# Patient Record
Sex: Female | Born: 2000 | Hispanic: No | Marital: Single | Smoking: Never smoker
Health system: Southern US, Community
[De-identification: ages and names within clinical notes are randomized; demographics above are authoritative.]

---
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
## 2020-01-08 ENCOUNTER — Other Ambulatory Visit: Payer: Self-pay

## 2020-01-08 ENCOUNTER — Ambulatory Visit: Payer: Self-pay

## 2020-01-08 DIAGNOSIS — Z20822 Contact with and (suspected) exposure to covid-19: Secondary | ICD-10-CM

## 2020-01-09 ENCOUNTER — Ambulatory Visit: Payer: Self-pay | Attending: Family Medicine | Admitting: Family Medicine

## 2020-01-09 LAB — SARS-COV-2, NAA 2 DAY TAT

## 2020-01-09 LAB — NOVEL CORONAVIRUS, NAA: SARS-CoV-2, NAA: DETECTED — AB

## 2020-01-23 ENCOUNTER — Other Ambulatory Visit: Payer: Self-pay

## 2020-01-23 ENCOUNTER — Ambulatory Visit: Payer: HRSA Program | Attending: Internal Medicine

## 2020-01-23 DIAGNOSIS — Z20822 Contact with and (suspected) exposure to covid-19: Secondary | ICD-10-CM | POA: Insufficient documentation

## 2020-01-24 LAB — NOVEL CORONAVIRUS, NAA: SARS-CoV-2, NAA: NOT DETECTED

## 2020-01-24 LAB — SARS-COV-2, NAA 2 DAY TAT

## 2021-03-06 ENCOUNTER — Encounter (HOSPITAL_COMMUNITY): Payer: Self-pay

## 2021-03-06 ENCOUNTER — Ambulatory Visit (HOSPITAL_COMMUNITY)
Admission: RE | Admit: 2021-03-06 | Discharge: 2021-03-06 | Disposition: A | Discharge status: Home / Self Care | Payer: Self-pay | Source: Ambulatory Visit | Attending: Student | Admitting: Student

## 2021-03-06 ENCOUNTER — Other Ambulatory Visit: Payer: Self-pay

## 2021-03-06 VITALS — BP 104/66 | HR 75 | Temp 99.2°F | Resp 15

## 2021-03-06 DIAGNOSIS — T162XXA Foreign body in left ear, initial encounter: Secondary | ICD-10-CM

## 2021-03-06 DIAGNOSIS — Z113 Encounter for screening for infections with a predominantly sexual mode of transmission: Secondary | ICD-10-CM

## 2021-03-06 DIAGNOSIS — N898 Other specified noninflammatory disorders of vagina: Secondary | ICD-10-CM

## 2021-03-06 LAB — POCT URINALYSIS DIPSTICK, ED / UC
Bilirubin Urine: NEGATIVE
Glucose, UA: NEGATIVE mg/dL
Ketones, ur: 15 mg/dL — AB
Nitrite: NEGATIVE
Protein, ur: 30 mg/dL — AB
Specific Gravity, Urine: 1.02 (ref 1.005–1.030)
Urobilinogen, UA: 1 mg/dL (ref 0.0–1.0)
pH: 7.5 (ref 5.0–8.0)

## 2021-03-06 LAB — POC URINE PREG, ED: Preg Test, Ur: NEGATIVE

## 2021-03-06 LAB — HIV ANTIBODY (ROUTINE TESTING W REFLEX): HIV Screen 4th Generation wRfx: NONREACTIVE

## 2021-03-06 NOTE — Discharge Instructions (Addendum)
-  We are testing your urine for bacteria, and we are also testing for BV, yeast, gonorrhea, chlamydia, trichomonas, HIV, syphilis.  We will call you if any of this is positive, and sent can send additional treatment at that time.  Do seek additional medical treatment if you develop worsening symptoms like abdominal pain, back pain, worsening vaginal symptoms, new fever/chills.  Abstain from intercourse until negative results. -Wait a few days until your ear irritation subsides. Purchase an over-the-counter Debrox kit.  You can use this to flush out your ears at home.  You can also purchase hydrogen peroxide and mix this half hydrogen peroxide and half water to flush out your ears.  If your symptoms persist despite this treatment, follow-up with an ear nose and throat doctor.  Information below, call them to schedule this.

## 2021-03-06 NOTE — ED Triage Notes (Signed)
Pt reports left ear pain x 2 days. Denies fever, drainage.

## 2021-03-06 NOTE — ED Provider Notes (Signed)
[facility]    CSN: [id number] Arrival date & time: 03/06/21  1435      History   Chief Complaint Chief Complaint  Patient presents with   Appointment    1500   Otalgia    HPI Patty Parker is a 20 y.o. female presenting for left ear pain for 2 days.  Also notes dysuria x3 weeks. Denies history UTIs in the past.  -States 2 days ago, she felt a bug crawl into her left ear, it scratched around and was painful, but this did resolve.  She does have some muffled hearing.  Denies recent URI.  Denies tinnitus, dizziness, fever/chills. -Also notices dysuria and external vaginal irritation for about 3 weeks.  She does have a new female partner.  Denies other vaginal or urinary symptoms including vaginal discharge, external vaginal lesions or rashes. Denies hematuria, frequency, urgency, back pain, n/v/d/abd pain, fevers/chills, abdnormal vaginal discharge/rashes.   HPI  History reviewed. No pertinent past medical history.  There are no problems to display for this patient.   History reviewed. No pertinent surgical history.  OB History   No obstetric history on file.      Home Medications    Prior to Admission medications   Medication Sig Start Date End Date Taking? Authorizing Provider  dicyclomine (BENTYL) 20 MG tablet Take 1 tablet (20 mg total) by mouth 2 (two) times daily as needed for spasms. 06/16/17   Charmayne Sheer, NP  meclizine (ANTIVERT) 12.5 MG tablet Take 1 tablet (12.5 mg total) by mouth 3 (three) times daily as needed for dizziness. 01/31/17   McDonald, Laymond Purser, PA-C    Family History Family History  Problem Relation Age of Onset   Healthy Mother    Healthy Father     Social History Social History   Tobacco Use   Smoking status: Never   Smokeless tobacco: Never  Vaping Use   Vaping Use: Never used  Substance Use Topics   Alcohol use: Never   Drug use: Never     Allergies   Patient has no known allergies.   Review of  Systems Review of Systems  Constitutional:  Negative for appetite change, chills, diaphoresis and fever.  HENT:  Positive for ear pain.   Respiratory:  Negative for shortness of breath.   Cardiovascular:  Negative for chest pain.  Gastrointestinal:  Negative for abdominal pain, blood in stool, constipation, diarrhea, nausea and vomiting.  Genitourinary:  Positive for dysuria. Negative for decreased urine volume, difficulty urinating, flank pain, frequency, genital sores, hematuria, menstrual problem, pelvic pain, urgency, vaginal bleeding, vaginal discharge and vaginal pain.  Musculoskeletal:  Negative for back pain.  Neurological:  Negative for dizziness, weakness and light-headedness.  All other systems reviewed and are negative.   Physical Exam Triage Vital Signs ED Triage Vitals  Enc Vitals Group     BP 03/06/21 1503 104/66     Pulse Rate 03/06/21 1503 75     Resp 03/06/21 1503 15     Temp 03/06/21 1503 99.2 F (37.3 C)     Temp Source 03/06/21 1503 Oral     SpO2 03/06/21 1503 98 %     Weight --      Height --      Head Circumference --      Peak Flow --      Pain Score 03/06/21 1501 3     Pain Loc --      Pain Edu? --      Excl.  in [place]? --    No data found.  Updated Vital Signs BP 104/66 (BP Location: Right Arm)   Pulse 75   Temp 99.2 F (37.3 C) (Oral)   Resp 15   LMP  (Within Months) Comment: 1 month  SpO2 98%   Visual Acuity Right Eye Distance:   Left Eye Distance:   Bilateral Distance:    Right Eye Near:   Left Eye Near:    Bilateral Near:     Physical Exam Vitals reviewed.  Constitutional:      General: She is not in acute distress.    Appearance: Normal appearance. She is not ill-appearing.  HENT:     Head: Normocephalic and atraumatic.     Right Ear: Hearing, tympanic membrane and external ear normal. No swelling or tenderness. There is no impacted cerumen. No foreign body. No mastoid tenderness. Tympanic membrane is not perforated, erythematous,  retracted or bulging.     Left Ear: Hearing, tympanic membrane and external ear normal. No swelling or tenderness. There is no impacted cerumen. A foreign body is present. No mastoid tenderness. Tympanic membrane is not perforated, erythematous, retracted or bulging.     Ears:     Comments: L ear initially with dead insect and cerumen present. Lavage performed but this was not successfully removed. Following lavage, canal was erythematous but no bleeding.     Nose:     Right Sinus: No maxillary sinus tenderness or frontal sinus tenderness.     Left Sinus: No maxillary sinus tenderness or frontal sinus tenderness.     Mouth/Throat:     Mouth: Mucous membranes are moist.     Pharynx: Uvula midline. No oropharyngeal exudate or posterior oropharyngeal erythema.     Tonsils: No tonsillar exudate.     Comments: Moist mucous membranes Eyes:     Extraocular Movements: Extraocular movements intact.     Pupils: Pupils are equal, round, and reactive to light.  Cardiovascular:     Rate and Rhythm: Normal rate and regular rhythm.     Heart sounds: Normal heart sounds.  Pulmonary:     Effort: Pulmonary effort is normal.     Breath sounds: Normal breath sounds and air entry. No wheezing, rhonchi or rales.  Chest:     Chest wall: No tenderness.  Abdominal:     General: Abdomen is flat. Bowel sounds are normal. There is no distension.     Palpations: Abdomen is soft. There is no mass.     Tenderness: There is no abdominal tenderness. There is no right CVA tenderness, left CVA tenderness, guarding or rebound.  Genitourinary:    Comments: deferred Lymphadenopathy:     Cervical: No cervical adenopathy.  Skin:    General: Skin is warm.     Capillary Refill: Capillary refill takes less than 2 seconds.     Comments: Good skin turgor  Neurological:     General: No focal deficit present.     Mental Status: She is alert and oriented to person, place, and time.  Psychiatric:        Attention and  Perception: Attention and perception normal.        Mood and Affect: Mood and affect normal.        Behavior: Behavior normal. Behavior is cooperative.        Thought Content: Thought content normal.        Judgment: Judgment normal.     UC Treatments / Results  Labs (all labs ordered are listed,  but only abnormal results are displayed) Labs Reviewed  POCT URINALYSIS DIPSTICK, ED / UC - Abnormal; Notable for the following components:      Result Value   Ketones, ur 15 (*)    Hgb urine dipstick SMALL (*)    Protein, ur 30 (*)    Leukocytes,Ua LARGE (*)    All other components within normal limits  URINE CULTURE  HIV ANTIBODY (ROUTINE TESTING W REFLEX)  RPR  POC URINE PREG, ED  CERVICOVAGINAL ANCILLARY ONLY    EKG   Radiology No results found.  Procedures Procedures (including critical care time)  Medications Ordered in UC Medications - No data to display  Initial Impression / Assessment and Plan / UC Course  I have reviewed the triage vital signs and the nursing notes.  Pertinent labs & imaging results that were available during my care of the patient were reviewed by me and considered in my medical decision making (see chart for details).     This patient is a 20 year old female presenting with insect in L ear and suspected BV. Today this pt is afebrile nontachycardic nontachypneic, oxygenating well on room air, no wheezes rhonchi or rales. No abd pain or CVAT.  Ear lavage performed but insect and cerumen was not successfully removed. Did not attempt removal with instrumentation as canal was irritated from lavage. Rec Debrox OTC and ENT f/u.   UA- small blood, large leuk. Culture sent. Will wait to treat as pt has minimal urinary symptoms. Urine pregnancy negative One new female partner-self swab sent for gonorrhea, chlamydia, trichomonas, BV, yeast. Safe sex precautions.  ED return precautions discussed. Patient verbalizes understanding and agreement.   Coding  this visit a Level 4 for time as I spent over 40 minutes evaluating this patient's multiple acute conditions, including labs, interpretation of labs, ear lavage, exam, treatment plan, and plan for follow-up.  Final Clinical Impressions(s) / UC Diagnoses   Final diagnoses:  Foreign body of left ear, initial encounter  Vaginal irritation  Routine screening for STI (sexually transmitted infection)     Discharge Instructions      -We are testing your urine for bacteria, and we are also testing for BV, yeast, gonorrhea, chlamydia, trichomonas, HIV, syphilis.  We will call you if any of this is positive, and sent can send additional treatment at that time.  Do seek additional medical treatment if you develop worsening symptoms like abdominal pain, back pain, worsening vaginal symptoms, new fever/chills.  Abstain from intercourse until negative results. -Wait a few days until your ear irritation subsides. Purchase an over-the-counter Debrox kit.  You can use this to flush out your ears at home.  You can also purchase hydrogen peroxide and mix this half hydrogen peroxide and half water to flush out your ears.  If your symptoms persist despite this treatment, follow-up with an ear nose and throat doctor.  Information below, call them to schedule this.     ED Prescriptions   None    PDMP not reviewed this encounter.   Hazel Sams, PA-C 03/06/21 1603

## 2021-03-07 LAB — RPR: RPR Ser Ql: NONREACTIVE

## 2021-03-08 LAB — URINE CULTURE: Culture: 70000 — AB

## 2021-03-09 LAB — CERVICOVAGINAL ANCILLARY ONLY
Bacterial Vaginitis (gardnerella): POSITIVE — AB
Candida Glabrata: NEGATIVE
Candida Vaginitis: POSITIVE — AB
Chlamydia: NEGATIVE
Comment: NEGATIVE
Comment: NEGATIVE
Comment: NEGATIVE
Comment: NEGATIVE
Comment: NEGATIVE
Comment: NORMAL
Neisseria Gonorrhea: NEGATIVE
Trichomonas: NEGATIVE

## 2021-03-12 ENCOUNTER — Other Ambulatory Visit: Payer: Self-pay

## 2021-03-12 ENCOUNTER — Telehealth (HOSPITAL_COMMUNITY): Payer: Self-pay | Admitting: Emergency Medicine

## 2021-03-12 MED ORDER — METRONIDAZOLE 500 MG PO TABS
500.0000 mg | ORAL_TABLET | Freq: Two times a day (BID) | ORAL | 0 refills | Status: DC
  Filled 2021-03-12: qty 14, 7d supply, fill #0

## 2021-03-12 MED ORDER — FLUCONAZOLE 150 MG PO TABS
150.0000 mg | ORAL_TABLET | Freq: Once | ORAL | 0 refills | Status: AC
  Filled 2021-03-12: qty 2, 2d supply, fill #0

## 2021-11-02 ENCOUNTER — Emergency Department (HOSPITAL_COMMUNITY): Payer: Self-pay

## 2021-11-02 ENCOUNTER — Encounter (HOSPITAL_COMMUNITY): Payer: Self-pay | Admitting: Emergency Medicine

## 2021-11-02 ENCOUNTER — Other Ambulatory Visit: Payer: Self-pay

## 2021-11-02 ENCOUNTER — Emergency Department (HOSPITAL_COMMUNITY)
Admission: EM | Admit: 2021-11-02 | Discharge: 2021-11-02 | Disposition: A | Discharge status: Home / Self Care | Payer: Self-pay | Attending: Emergency Medicine | Admitting: Emergency Medicine

## 2021-11-02 DIAGNOSIS — Y92410 Unspecified street and highway as the place of occurrence of the external cause: Secondary | ICD-10-CM | POA: Insufficient documentation

## 2021-11-02 DIAGNOSIS — S0181XA Laceration without foreign body of other part of head, initial encounter: Secondary | ICD-10-CM | POA: Insufficient documentation

## 2021-11-02 DIAGNOSIS — Z23 Encounter for immunization: Secondary | ICD-10-CM | POA: Insufficient documentation

## 2021-11-02 DIAGNOSIS — S0993XA Unspecified injury of face, initial encounter: Secondary | ICD-10-CM | POA: Diagnosis present

## 2021-11-02 LAB — I-STAT BETA HCG BLOOD, ED (MC, WL, AP ONLY): I-stat hCG, quantitative: <5 m[IU]/mL (ref <5)

## 2021-11-02 MED ORDER — LIDOCAINE-EPINEPHRINE (PF) 2 %-1:200000 IJ SOLN
20.0000 mL | Freq: Once | INTRAMUSCULAR | Status: AC
  Administered 2021-11-02: 20 mL
  Filled 2021-11-02: qty 20

## 2021-11-02 MED ORDER — IBUPROFEN 800 MG PO TABS: 800.0000 mg | ORAL_TABLET | Freq: Three times a day (TID) | ORAL | 0 refills | Status: DC

## 2021-11-02 MED ORDER — TETANUS-DIPHTH-ACELL PERTUSSIS 5-2.5-18.5 LF-MCG/0.5 IM SUSY
0.5000 mL | PREFILLED_SYRINGE | Freq: Once | INTRAMUSCULAR | Status: AC
  Administered 2021-11-02: 0.5 mL via INTRAMUSCULAR
  Filled 2021-11-02: qty 0.5

## 2021-11-02 MED ORDER — CYCLOBENZAPRINE HCL 10 MG PO TABS: 10.0000 mg | ORAL_TABLET | Freq: Three times a day (TID) | ORAL | 0 refills | Status: DC | PRN

## 2021-11-02 NOTE — ED Notes (Signed)
Patient transported to CT 

## 2021-11-02 NOTE — Discharge Instructions (Addendum)
Call the number listed for Dr. Janace Hoard.  You will need to see him on Monday.  Ask the receptionist for the office location.  In the meantime, keep your wounds clean and dry.  Take medications as prescribed.
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
## 2021-11-08 ENCOUNTER — Ambulatory Visit (INDEPENDENT_AMBULATORY_CARE_PROVIDER_SITE_OTHER): Payer: Self-pay | Admitting: Otolaryngology

## 2021-11-08 ENCOUNTER — Encounter (INDEPENDENT_AMBULATORY_CARE_PROVIDER_SITE_OTHER): Payer: Self-pay | Admitting: Otolaryngology

## 2021-11-08 ENCOUNTER — Other Ambulatory Visit: Payer: Self-pay

## 2021-11-08 VITALS — BP 107/69

## 2021-11-08 DIAGNOSIS — S0181XA Laceration without foreign body of other part of head, initial encounter: Secondary | ICD-10-CM
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
## 2022-01-06 ENCOUNTER — Institutional Professional Consult (permissible substitution): Payer: Self-pay | Admitting: Plastic Surgery

## 2022-06-10 ENCOUNTER — Ambulatory Visit: Payer: Self-pay

## 2022-06-10 ENCOUNTER — Ambulatory Visit
Admission: RE | Admit: 2022-06-10 | Discharge: 2022-06-10 | Disposition: A | Discharge status: Home / Self Care | Payer: Self-pay | Source: Ambulatory Visit | Attending: Emergency Medicine | Admitting: Emergency Medicine

## 2022-06-10 VITALS — BP 103/62 | HR 59 | Temp 97.9°F | Resp 16

## 2022-06-10 DIAGNOSIS — N3090 Cystitis, unspecified without hematuria: Secondary | ICD-10-CM

## 2022-06-10 LAB — POCT URINE PREGNANCY: Preg Test, Ur: NEGATIVE

## 2022-06-10 LAB — POCT URINALYSIS DIP (MANUAL ENTRY)
Bilirubin, UA: NEGATIVE
Glucose, UA: NEGATIVE mg/dL
Ketones, POC UA: NEGATIVE mg/dL
Nitrite, UA: NEGATIVE
Protein Ur, POC: NEGATIVE mg/dL
Spec Grav, UA: >=1.03 — AB (ref 1.010–1.025)
Urobilinogen, UA: 0.2 E.U./dL
pH, UA: 5.5 (ref 5.0–8.0)

## 2022-06-10 MED ORDER — SULFAMETHOXAZOLE-TRIMETHOPRIM 800-160 MG PO TABS: 1.0000 | ORAL_TABLET | Freq: Two times a day (BID) | ORAL | 0 refills | Status: AC

## 2022-06-10 NOTE — ED Triage Notes (Signed)
The patient c/o frequent urination that started Sunday. The patient denies other symptoms.  Home interventions: Azo (last taken yesterday)

## 2022-06-10 NOTE — Discharge Instructions (Addendum)
The urinalysis that we performed in the clinic today was abnormal.      You were advised to begin antibiotics today because your urinalysis is abnormal and you are having active symptoms of an acute lower urinary tract infection also known as cystitis.  It is very important that you take all doses exactly as prescribed.  Incomplete antibiotic therapy can cause worsening urinary tract infection that can become aggressive, escape from urinary tract into your bloodstream causing sepsis which will require hospitalization.   Please pick up and begin taking your prescription for Bactrim DS (trimethoprim sulfamethoxazole) as soon as possible.  Please take all doses exactly as prescribed.  You can take this medication with or without food.  This medication is safe to take with your other medications.   If you have not had complete resolution of your symptoms after completing treatment as prescribed, please return to urgent care for repeat evaluation or follow-up with your primary care provider.   Thank you for visiting urgent care today.  I appreciate the opportunity to participate in your care.  
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
## 2024-10-11 ENCOUNTER — Encounter (HOSPITAL_COMMUNITY): Payer: Self-pay

## 2024-10-11 ENCOUNTER — Ambulatory Visit (HOSPITAL_COMMUNITY)
Admission: RE | Admit: 2024-10-11 | Discharge: 2024-10-11 | Disposition: A | Discharge status: Home / Self Care | Payer: Self-pay | Source: Ambulatory Visit | Attending: Internal Medicine | Admitting: Internal Medicine

## 2024-10-11 VITALS — BP 111/69 | HR 88 | Temp 98.4°F | Resp 16 | Ht 63.0 in | Wt 125.0 lb

## 2024-10-11 DIAGNOSIS — Z3201 Encounter for pregnancy test, result positive: Secondary | ICD-10-CM

## 2024-10-11 DIAGNOSIS — N926 Irregular menstruation, unspecified: Secondary | ICD-10-CM

## 2024-10-11 LAB — POCT URINE DIPSTICK
Bilirubin, UA: NEGATIVE
Blood, UA: NEGATIVE
Glucose, UA: NEGATIVE mg/dL
Ketones, POC UA: NEGATIVE mg/dL
Leukocytes, UA: NEGATIVE
Nitrite, UA: NEGATIVE
POC PROTEIN,UA: NEGATIVE
Spec Grav, UA: 1.015
Urobilinogen, UA: 0.2 U/dL
pH, UA: 7.5

## 2024-10-11 LAB — POCT URINE PREGNANCY: Preg Test, Ur: POSITIVE — AB
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
## 2024-10-11 NOTE — Discharge Instructions (Addendum)
 You had a positive pregnancy test in clinic today.  Please start prenatal vitamins daily.  You should abstain from alcohol, drug use, tobacco.  Many over-the-counter medications can be harmful so please avoid most of these with the exception of Tylenol .  You should avoid anything that raises your core body temperature including a sauna as in hot tubs.  Make sure that all of the food you are eating is well cooked and avoid anything that is real including uncooked lunch meat.  You will need to establish with an OB/GYN; call to schedule an appointment.  If you have any abdominal pain, nausea/vomiting interfering with oral intake, pelvic pain, bleeding you should be evaluated immediately.  Center for [facility] Healthcare locations: Hours may vary. Please call for an appointment  Center for [facility] Healthcare at [facility] for Women            [address], [place], [place] [address] [phone]  Center for [facility] at [facility]                                                            [address], [place], [place], [address] [phone]  Center for [facility] at [facility] [address], [place], [place], [address] [phone]  Center for [facility] at [facility] [address], [place], [place], [address] [phone]  Center for [facility] at [facility]                                [address] [place], [place], [place], [address] [phone]  Center for [facility] at [facility] [address] [place], [place], [address] [phone]  Center for [facility] Healthcare at [facility] [address], [place], [place], [address]

## 2024-10-11 NOTE — ED Triage Notes (Signed)
 Pt st's she had a positive home preg test and just wants to be checked  Pt denies any abd pain or vaginal bleeding
